# Patient Record
Sex: Male | Born: 1995 | Race: Black or African American | Marital: Single | Smoking: Current every day smoker
Health system: Southern US, Community
[De-identification: ages and names within clinical notes are randomized; demographics above are authoritative.]

---

## 2018-02-25 ENCOUNTER — Ambulatory Visit: Payer: Self-pay | Admitting: Emergency Medicine

## 2018-02-25 ENCOUNTER — Ambulatory Visit: Payer: BLUE CROSS/BLUE SHIELD | Admitting: Emergency Medicine

## 2018-02-25 ENCOUNTER — Other Ambulatory Visit: Payer: Self-pay

## 2018-02-25 ENCOUNTER — Telehealth: Payer: Self-pay | Admitting: Emergency Medicine

## 2018-02-25 ENCOUNTER — Encounter: Payer: Self-pay | Admitting: Emergency Medicine

## 2018-02-25 VITALS — BP 122/75 | HR 59 | Temp 98.1°F | Resp 16 | Ht 70.0 in | Wt 142.0 lb

## 2018-02-25 DIAGNOSIS — R1084 Generalized abdominal pain: Secondary | ICD-10-CM

## 2018-02-25 DIAGNOSIS — Z23 Encounter for immunization: Secondary | ICD-10-CM | POA: Diagnosis not present

## 2018-02-25 NOTE — Telephone Encounter (Signed)
Copied from CRM [phone]. Topic: Quick Communication - See Telephone Encounter >> Feb 25, 2018  2:33 PM Jens SomMedley, Jennifer A wrote: CRM for notification. See Telephone encounter for: 02/25/18. Patient wanted to give Almira CoasterGina His mothers date of birth 04/16/1968

## 2018-02-25 NOTE — Patient Instructions (Addendum)
     If you have lab work done today you will be contacted with your lab results within the next 2 weeks.  If you have not heard from us then please contact us. The fastest way to get your results is to register for My Chart.   IF you received an x-ray today, you will receive an invoice from [facility]. Please contact [facility] at [phone] with questions or concerns regarding your invoice.   IF you received labwork today, you will receive an invoice from [facility]. Please contact [facility] at [phone] with questions or concerns regarding your invoice.   Our billing staff will not be able to assist you with questions regarding bills from these companies.  You will be contacted with the lab results as soon as they are available. The fastest way to get your results is to activate your My Chart account. Instructions are located on the last page of this paperwork. If you have not heard from us regarding the results in 2 weeks, please contact this office.     Abdominal Pain, Adult Many things can cause belly (abdominal) pain. Most times, belly pain is not dangerous. Many cases of belly pain can be watched and treated at home. Sometimes belly pain is serious, though. Your doctor will try to find the cause of your belly pain. Follow these instructions at home:  Take over-the-counter and prescription medicines only as told by your doctor. Do not take medicines that help you poop (laxatives) unless told to by your doctor.  Drink enough fluid to keep your pee (urine) clear or pale yellow.  Watch your belly pain for any changes.  Keep all follow-up visits as told by your doctor. This is important. Contact a doctor if:  Your belly pain changes or gets worse.  You are not hungry, or you lose weight without trying.  You are having trouble pooping (constipated) or have watery poop (diarrhea) for more than 2-3 days.  You have pain when you pee or poop.  Your belly pain  wakes you up at night.  Your pain gets worse with meals, after eating, or with certain foods.  You are throwing up and cannot keep anything down.  You have a fever. Get help right away if:  Your pain does not go away as soon as your doctor says it should.  You cannot stop throwing up.  Your pain is only in areas of your belly, such as the right side or the left lower part of the belly.  You have bloody or black poop, or poop that looks like tar.  You have very bad pain, cramping, or bloating in your belly.  You have signs of not having enough fluid or water in your body (dehydration), such as: ? Dark pee, very little pee, or no pee. ? Cracked lips. ? Dry mouth. ? Sunken eyes. ? Sleepiness. ? Weakness. This information is not intended to replace advice given to you by your health care provider. Make sure you discuss any questions you have with your health care provider. Document Released: 11/12/2007 Document Revised: 12/14/2015 Document Reviewed: 11/07/2015 Elsevier Interactive Patient Education  2018 Elsevier Inc.  

## 2018-02-25 NOTE — Progress Notes (Signed)
Christopher Montgomery 22 y.o.   Chief Complaint  Patient presents with  . Establish Care  . Abdominal Pain    per patient he has this problem on/off x 8 months after eating    HISTORY OF PRESENT ILLNESS: This is a 22 y.o. male here to establish care. Has been having mid abdominal pain on and off for the past 8 months with no particular trigger.  Pain is brief and crampy with no radiation no associated symptoms.  Denies rectal bleeding or melena.  Smoker.  Non-EtOH abuser.  HPI   Prior to Admission medications   Not on File    No Known Allergies  There are no active problems to display for this patient.   No past medical history on file.    Social History   Socioeconomic History  . Marital status: Single    Spouse name: Not on file  . Number of children: Not on file  . Years of education: Not on file  . Highest education level: Not on file  Occupational History  . Not on file  Social Needs  . Financial resource strain: Not on file  . Food insecurity:    Worry: Not on file    Inability: Not on file  . Transportation needs:    Medical: Not on file    Non-medical: Not on file  Tobacco Use  . Smoking status: Current Every Day Smoker    Types: Cigarettes  . Smokeless tobacco: Never Used  . Tobacco comment: 3-4 cigarettes a day  Substance and Sexual Activity  . Alcohol use: Never    Frequency: Never  . Drug use: Never  . Sexual activity: Not on file  Lifestyle  . Physical activity:    Days per week: Not on file    Minutes per session: Not on file  . Stress: Not on file  Relationships  . Social connections:    Talks on phone: Not on file    Gets together: Not on file    Attends religious service: Not on file    Active member of club or organization: Not on file    Attends meetings of clubs or organizations: Not on file    Relationship status: Not on file  . Intimate partner violence:    Fear of current or ex partner: Not on file    Emotionally abused: Not  on file    Physically abused: Not on file    Forced sexual activity: Not on file  Other Topics Concern  . Not on file  Social History Narrative  . Not on file    No family history on file.   Review of Systems  Constitutional: Negative.  Negative for chills and fever.  HENT: Negative.  Negative for hearing loss and sore throat.   Eyes: Negative.  Negative for blurred vision and double vision.  Respiratory: Negative.  Negative for cough and hemoptysis.   Cardiovascular: Negative.  Negative for chest pain and palpitations.  Gastrointestinal: Positive for abdominal pain. Negative for blood in stool, heartburn, melena, nausea and vomiting.  Genitourinary: Negative.  Negative for dysuria.  Musculoskeletal: Negative.  Negative for back pain, joint pain, myalgias and neck pain.  Skin: Negative.  Negative for rash.  Neurological: Negative.  Negative for dizziness and headaches.  Endo/Heme/Allergies: Negative.   All other systems reviewed and are negative.   Vitals:   02/25/18 1202  BP: 122/75  Pulse: (!) 59  Resp: 16  Temp: 98.1 F (36.7 C)  SpO2: 99%  Physical Exam  Constitutional: He is oriented to person, place, and time. He appears well-developed and well-nourished.  HENT:  Head: Normocephalic and atraumatic.  Nose: Nose normal.  Mouth/Throat: Oropharynx is clear and moist.  Eyes: Pupils are equal, round, and reactive to light. Conjunctivae and EOM are normal.  Neck: Normal range of motion. Neck supple. No JVD present.  Cardiovascular: Normal rate, regular rhythm and normal heart sounds.  Pulmonary/Chest: Effort normal and breath sounds normal.  Abdominal: Soft. Bowel sounds are normal. He exhibits no distension and no mass. There is no tenderness.  Musculoskeletal: Normal range of motion. He exhibits no edema.  Lymphadenopathy:    He has no cervical adenopathy.  Neurological: He is alert and oriented to person, place, and time. No sensory deficit. He exhibits normal  muscle tone. Coordination normal.  Skin: Skin is warm and dry. Capillary refill takes less than 2 seconds. No rash noted.  Psychiatric: He has a normal mood and affect. His behavior is normal.  Vitals reviewed.    ASSESSMENT & PLAN: Christopher Montgomery was seen today for establish care and abdominal pain.  Diagnoses and all orders for this visit:  Generalized abdominal pain -     Comprehensive metabolic panel -     CBC with Differential/Platelet -     Lipase  Need for prophylactic vaccination and inoculation against influenza -     Flu Vaccine QUAD 36+ mos IM    Patient Instructions       If you have lab work done today you will be contacted with your lab results within the next 2 weeks.  If you have not heard from us then please contact us. The fastest way to get your results is to register for My Chart.   IF you received an x-ray today, you will receive an invoice from [facility]. Please contact [facility] at [phone] with questions or concerns regarding your invoice.   IF you received labwork today, you will receive an invoice from [facility]. Please contact [facility] at [phone] with questions or concerns regarding your invoice.   Our billing staff will not be able to assist you with questions regarding bills from these companies.  You will be contacted with the lab results as soon as they are available. The fastest way to get your results is to activate your My Chart account. Instructions are located on the last page of this paperwork. If you have not heard from us regarding the results in 2 weeks, please contact this office.     Abdominal Pain, Adult Many things can cause belly (abdominal) pain. Most times, belly pain is not dangerous. Many cases of belly pain can be watched and treated at home. Sometimes belly pain is serious, though. Your doctor will try to find the cause of your belly pain. Follow these instructions at home:  Take over-the-counter  and prescription medicines only as told by your doctor. Do not take medicines that help you poop (laxatives) unless told to by your doctor.  Drink enough fluid to keep your pee (urine) clear or pale yellow.  Watch your belly pain for any changes.  Keep all follow-up visits as told by your doctor. This is important. Contact a doctor if:  Your belly pain changes or gets worse.  You are not hungry, or you lose weight without trying.  You are having trouble pooping (constipated) or have watery poop (diarrhea) for more than 2-3 days.  You have pain when you pee or poop.  Your belly pain  wakes you up at night.  Your pain gets worse with meals, after eating, or with certain foods.  You are throwing up and cannot keep anything down.  You have a fever. Get help right away if:  Your pain does not go away as soon as your doctor says it should.  You cannot stop throwing up.  Your pain is only in areas of your belly, such as the right side or the left lower part of the belly.  You have bloody or black poop, or poop that looks like tar.  You have very bad pain, cramping, or bloating in your belly.  You have signs of not having enough fluid or water in your body (dehydration), such as: ? Dark pee, very little pee, or no pee. ? Cracked lips. ? Dry mouth. ? Sunken eyes. ? Sleepiness. ? Weakness. This information is not intended to replace advice given to you by your health care provider. Make sure you discuss any questions you have with your health care provider. Document Released: 11/12/2007 Document Revised: 12/14/2015 Document Reviewed: 11/07/2015 Elsevier Interactive Patient Education  2018 Elsevier Inc.      Edwina Barth, MD [facility]

## 2018-02-26 LAB — LIPASE: LIPASE: 27 U/L (ref 13–78)

## 2018-02-26 LAB — CBC WITH DIFFERENTIAL/PLATELET
BASOS: 1 %
Basophils Absolute: 0 10*3/uL (ref 0.0–0.2)
EOS (ABSOLUTE): 0.1 10*3/uL (ref 0.0–0.4)
Eos: 3 %
HEMATOCRIT: 47.4 % (ref 37.5–51.0)
HEMOGLOBIN: 15.5 g/dL (ref 13.0–17.7)
IMMATURE GRANULOCYTES: 0 %
Immature Grans (Abs): 0 10*3/uL (ref 0.0–0.1)
LYMPHS: 60 %
Lymphocytes Absolute: 2.1 10*3/uL (ref 0.7–3.1)
MCH: 29 pg (ref 26.6–33.0)
MCHC: 32.7 g/dL (ref 31.5–35.7)
MCV: 89 fL (ref 79–97)
Monocytes Absolute: 0.3 10*3/uL (ref 0.1–0.9)
Monocytes: 7 %
NEUTROS PCT: 29 %
Neutrophils Absolute: 1 10*3/uL — ABNORMAL LOW (ref 1.4–7.0)
Platelets: 118 10*3/uL — ABNORMAL LOW (ref 150–450)
RBC: 5.35 x10E6/uL (ref 4.14–5.80)
RDW: 12.7 % (ref 12.3–15.4)
WBC: 3.5 10*3/uL (ref 3.4–10.8)

## 2018-02-26 LAB — COMPREHENSIVE METABOLIC PANEL
A/G RATIO: 2 (ref 1.2–2.2)
ALT: 12 IU/L (ref 0–44)
AST: 18 IU/L (ref 0–40)
Albumin: 4.9 g/dL (ref 3.5–5.5)
Alkaline Phosphatase: 100 IU/L (ref 39–117)
BILIRUBIN TOTAL: 0.8 mg/dL (ref 0.0–1.2)
BUN/Creatinine Ratio: 7 — ABNORMAL LOW (ref 9–20)
BUN: 7 mg/dL (ref 6–20)
CHLORIDE: 102 mmol/L (ref 96–106)
CO2: 25 mmol/L (ref 20–29)
Calcium: 10.2 mg/dL (ref 8.7–10.2)
Creatinine, Ser: 0.98 mg/dL (ref 0.76–1.27)
GFR calc non Af Amer: 109 mL/min/{1.73_m2} (ref >59)
GFR, EST AFRICAN AMERICAN: 126 mL/min/{1.73_m2} (ref >59)
Globulin, Total: 2.4 g/dL (ref 1.5–4.5)
Glucose: 85 mg/dL (ref 65–99)
POTASSIUM: 4.6 mmol/L (ref 3.5–5.2)
Sodium: 141 mmol/L (ref 134–144)
Total Protein: 7.3 g/dL (ref 6.0–8.5)

## 2020-03-12 ENCOUNTER — Emergency Department (HOSPITAL_COMMUNITY)
Admission: EM | Admit: 2020-03-12 | Discharge: 2020-03-13 | Disposition: A | Discharge status: Home / Self Care | Payer: BC Managed Care – PPO | Attending: Emergency Medicine | Admitting: Emergency Medicine

## 2020-03-12 ENCOUNTER — Emergency Department (HOSPITAL_COMMUNITY): Payer: BC Managed Care – PPO

## 2020-03-12 DIAGNOSIS — Y248XXA Other firearm discharge, undetermined intent, initial encounter: Secondary | ICD-10-CM | POA: Diagnosis not present

## 2020-03-12 DIAGNOSIS — Z23 Encounter for immunization: Secondary | ICD-10-CM | POA: Diagnosis not present

## 2020-03-12 DIAGNOSIS — F17210 Nicotine dependence, cigarettes, uncomplicated: Secondary | ICD-10-CM | POA: Diagnosis not present

## 2020-03-12 DIAGNOSIS — W3400XA Accidental discharge from unspecified firearms or gun, initial encounter: Secondary | ICD-10-CM

## 2020-03-12 DIAGNOSIS — S31809A Unspecified open wound of unspecified buttock, initial encounter: Secondary | ICD-10-CM | POA: Insufficient documentation

## 2020-03-12 LAB — CBC
HCT: 41.6 % (ref 39.0–52.0)
Hemoglobin: 14 g/dL (ref 13.0–17.0)
MCH: 30.8 pg (ref 26.0–34.0)
MCHC: 33.7 g/dL (ref 30.0–36.0)
MCV: 91.4 fL (ref 80.0–100.0)
Platelets: 163 10*3/uL (ref 150–400)
RBC: 4.55 MIL/uL (ref 4.22–5.81)
RDW: 12.7 % (ref 11.5–15.5)
WBC: 5.6 10*3/uL (ref 4.0–10.5)
nRBC: 0 % (ref 0.0–0.2)

## 2020-03-12 LAB — BASIC METABOLIC PANEL
Anion gap: 11 (ref 5–15)
BUN: 11 mg/dL (ref 6–20)
CO2: 23 mmol/L (ref 22–32)
Calcium: 9.1 mg/dL (ref 8.9–10.3)
Chloride: 104 mmol/L (ref 98–111)
Creatinine, Ser: 1.17 mg/dL (ref 0.61–1.24)
GFR calc Af Amer: >60 mL/min (ref >60)
GFR calc non Af Amer: >60 mL/min (ref >60)
Glucose, Bld: 88 mg/dL (ref 70–99)
Potassium: 4.1 mmol/L (ref 3.5–5.1)
Sodium: 138 mmol/L (ref 135–145)

## 2020-03-12 MED ORDER — BACITRACIN ZINC 500 UNIT/GM EX OINT
1.0000 "application " | TOPICAL_OINTMENT | Freq: Two times a day (BID) | CUTANEOUS | Status: DC
  Administered 2020-03-12: 1 via TOPICAL
  Filled 2020-03-12: qty 0.9

## 2020-03-12 MED ORDER — TETANUS-DIPHTH-ACELL PERTUSSIS 5-2.5-18.5 LF-MCG/0.5 IM SUSP
0.5000 mL | Freq: Once | INTRAMUSCULAR | Status: AC
  Administered 2020-03-12: 0.5 mL via INTRAMUSCULAR
  Filled 2020-03-12: qty 0.5

## 2020-03-12 MED ORDER — IOHEXOL 300 MG/ML  SOLN
100.0000 mL | Freq: Once | INTRAMUSCULAR | Status: AC | PRN
  Administered 2020-03-12: 100 mL via INTRAVENOUS

## 2020-03-12 MED ORDER — BACITRACIN ZINC 500 UNIT/GM EX OINT: 1.0000 "application " | TOPICAL_OINTMENT | Freq: Two times a day (BID) | CUTANEOUS | 0 refills | Status: AC

## 2020-03-12 MED ORDER — NAPROXEN 500 MG PO TABS: 500.0000 mg | ORAL_TABLET | Freq: Two times a day (BID) | ORAL | 0 refills | Status: AC
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
## 2020-03-12 NOTE — ED Notes (Signed)
Pt to CT at this time.

## 2020-03-13 MED ORDER — HYDROCODONE-ACETAMINOPHEN 5-325 MG PO TABS
1.0000 | ORAL_TABLET | Freq: Four times a day (QID) | ORAL | 0 refills | Status: AC | PRN
Start: 2020-03-13 — End: ?

## 2020-03-13 MED ORDER — IBUPROFEN 800 MG PO TABS
800.0000 mg | ORAL_TABLET | Freq: Once | ORAL | Status: AC
  Administered 2020-03-13: 800 mg via ORAL
  Filled 2020-03-13: qty 1

## 2020-03-13 NOTE — ED Notes (Signed)
Pt using staff phone to call ride

## 2020-03-13 NOTE — ED Notes (Signed)
Patient verbalizes understanding of discharge instructions. Opportunity for questioning and answers were provided. Armband removed by staff, pt discharged from ED stable & ambulatory via taxi voucher
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
